# Patient Record
Sex: Female | Born: 2000 | Race: Black or African American | Hispanic: No | Marital: Single
Health system: Southern US, Community
[De-identification: ages and names within clinical notes are randomized; demographics above are authoritative.]

---

## 2020-12-05 ENCOUNTER — Emergency Department (HOSPITAL_COMMUNITY)
Admission: EM | Admit: 2020-12-05 | Discharge: 2020-12-05 | Disposition: A | Discharge status: Home / Self Care | Payer: BC Managed Care – PPO | Attending: Emergency Medicine | Admitting: Emergency Medicine

## 2020-12-05 ENCOUNTER — Other Ambulatory Visit: Payer: Self-pay

## 2020-12-05 ENCOUNTER — Emergency Department (HOSPITAL_COMMUNITY): Payer: BC Managed Care – PPO

## 2020-12-05 ENCOUNTER — Encounter (HOSPITAL_COMMUNITY): Payer: Self-pay

## 2020-12-05 DIAGNOSIS — R059 Cough, unspecified: Secondary | ICD-10-CM | POA: Diagnosis present

## 2020-12-05 DIAGNOSIS — J069 Acute upper respiratory infection, unspecified: Secondary | ICD-10-CM

## 2020-12-05 DIAGNOSIS — J988 Other specified respiratory disorders: Secondary | ICD-10-CM | POA: Diagnosis not present

## 2020-12-05 MED ORDER — AZITHROMYCIN 250 MG PO TABS: 250.0000 mg | ORAL_TABLET | Freq: Every day | ORAL | 0 refills | Status: AC

## 2020-12-05 NOTE — Discharge Instructions (Signed)
Recommend follow-up with your primary doctor.  Take antibiotic as prescribed.  If you develop blood in sputum, difficulty breathing, chest pain or other new concerning symptom, return to ER for reassessment.

## 2020-12-05 NOTE — ED Notes (Signed)
An After Visit Summary was printed and given to the patient. Discharge instructions given and no further questions at this time.  

## 2020-12-05 NOTE — ED Provider Notes (Signed)
[facility]-EMERGENCY DEPT Provider Note   CSN: [id number] Arrival date & time: 12/05/20  1812     History Chief Complaint  Patient presents with  . Cough    Erica Villan[facility]va is a 20 y.o. female.  Patient reports for the past 2 or 3 weeks she has been having intermittent cough.  Productive, mostly green sputum.  Also has had some nasal congestion.  No difficulty in breathing, no chest pain, no fevers.  States that there was a COVID outbreak at her school however she states that she was recently tested and was negative for COVID-19.  She denies any medical problems.  Had a sore throat last week but this has since resolved.  HPI     History reviewed. No pertinent past medical history.  There are no problems to display for this patient.   History reviewed. No pertinent surgical history.   OB History   No obstetric history on file.     History reviewed. No pertinent family history.     Home Medications Prior to Admission medications   Medication Sig Start Date End Date Taking? Authorizing Provider  azithromycin (ZITHROMAX) 250 MG tablet Take 1 tablet (250 mg total) by mouth daily. Take first 2 tablets together, then 1 every day until finished. 12/05/20  Yes Milagros Loll, MD    Allergies    Patient has no known allergies.  Review of Systems   Review of Systems  Constitutional: Negative for chills, fatigue and fever.  HENT: Positive for sore throat. Negative for ear pain.   Eyes: Negative for pain and visual disturbance.  Respiratory: Positive for cough. Negative for shortness of breath.   Cardiovascular: Negative for chest pain and palpitations.  Gastrointestinal: Negative for abdominal pain and vomiting.  Genitourinary: Negative for dysuria and hematuria.  Musculoskeletal: Negative for arthralgias and back pain.  Skin: Negative for color change and rash.  Neurological: Negative for seizures and syncope.  All other systems reviewed and are  negative.   Physical Exam Updated Vital Signs BP 120/73 (BP Location: Left Arm)   Pulse 60   Temp 98.6 F (37 C) (Oral)   Resp 16   Ht 5\' 4"  (1.626 m)   Wt 86.3 kg   LMP 12/05/2020   SpO2 98%   BMI 32.66 kg/m   Physical Exam Vitals and nursing note reviewed.  Constitutional:      General: She is not in acute distress.    Appearance: She is well-developed.  HENT:     Head: Normocephalic and atraumatic.  Eyes:     Conjunctiva/sclera: Conjunctivae normal.  Cardiovascular:     Rate and Rhythm: Normal rate and regular rhythm.     Heart sounds: No murmur heard.   Pulmonary:     Effort: Pulmonary effort is normal. No respiratory distress.     Breath sounds: Normal breath sounds.  Abdominal:     Palpations: Abdomen is soft.     Tenderness: There is no abdominal tenderness.  Musculoskeletal:     Cervical back: Neck supple.  Skin:    General: Skin is warm and dry.  Neurological:     General: No focal deficit present.     Mental Status: She is alert.  Psychiatric:        Mood and Affect: Mood normal.     ED Results / Procedures / Treatments   Labs (all labs ordered are listed, but only abnormal results are displayed) Labs Reviewed - No data to display  EKG  None  Radiology DG Chest 2 View  Result Date: 12/05/2020 CLINICAL DATA:  20 year old female with cough. EXAM: CHEST - 2 VIEW COMPARISON:  None. FINDINGS: The heart size and mediastinal contours are within normal limits. Both lungs are clear. The visualized skeletal structures are unremarkable. IMPRESSION: No active cardiopulmonary disease. Electronically Signed   By: Elgie Collard M.D.   On: 12/05/2020 19:08    Procedures Procedures   Medications Ordered in ED Medications - No data to display  ED Course  I have reviewed the triage vital signs and the nursing notes.  Pertinent labs & imaging results that were available during my care of the patient were reviewed by me and considered in my medical  decision making (see chart for details).    MDM Rules/Calculators/A&P                         20 year old otherwise healthy lady presents to ER with concern for cough.  On exam she is well-appearing with stable vital signs.  CXR negative for pneumonia.  Suspect likely viral upper respiratory illness.  Given duration and productive cough, raise concern for possible bronchitis.  Will give course of azithromycin to cover possible bacterial cause.  At present believe patient is appropriate for outpatient management.  Recommended follow-up with her primary doctor.  After the discussed management above, the patient was determined to be safe for discharge.  The patient was in agreement with this plan and all questions regarding their care were answered.  ED return precautions were discussed and the patient will return to the ED with any significant worsening of condition.    Final Clinical Impression(s) / ED Diagnoses Final diagnoses:  Viral upper respiratory illness    Rx / DC Orders ED Discharge Orders         Ordered    azithromycin (ZITHROMAX) 250 MG tablet  Daily        12/05/20 1944           Milagros Loll, MD 12/05/20 1947

## 2020-12-05 NOTE — ED Triage Notes (Signed)
Pt c/o cough x3 weeks. Pt has no other complaints at this time. Pt states she tested COVID- yesterday at her school.

## 2022-03-27 IMAGING — CR DG CHEST 2V
2 series · 2 of 2 positions shown · non-contrast
Comparison: None.

CLINICAL DATA: 19-year-old female with cough.

EXAM:
CHEST - 2 VIEW

[w chest pa]
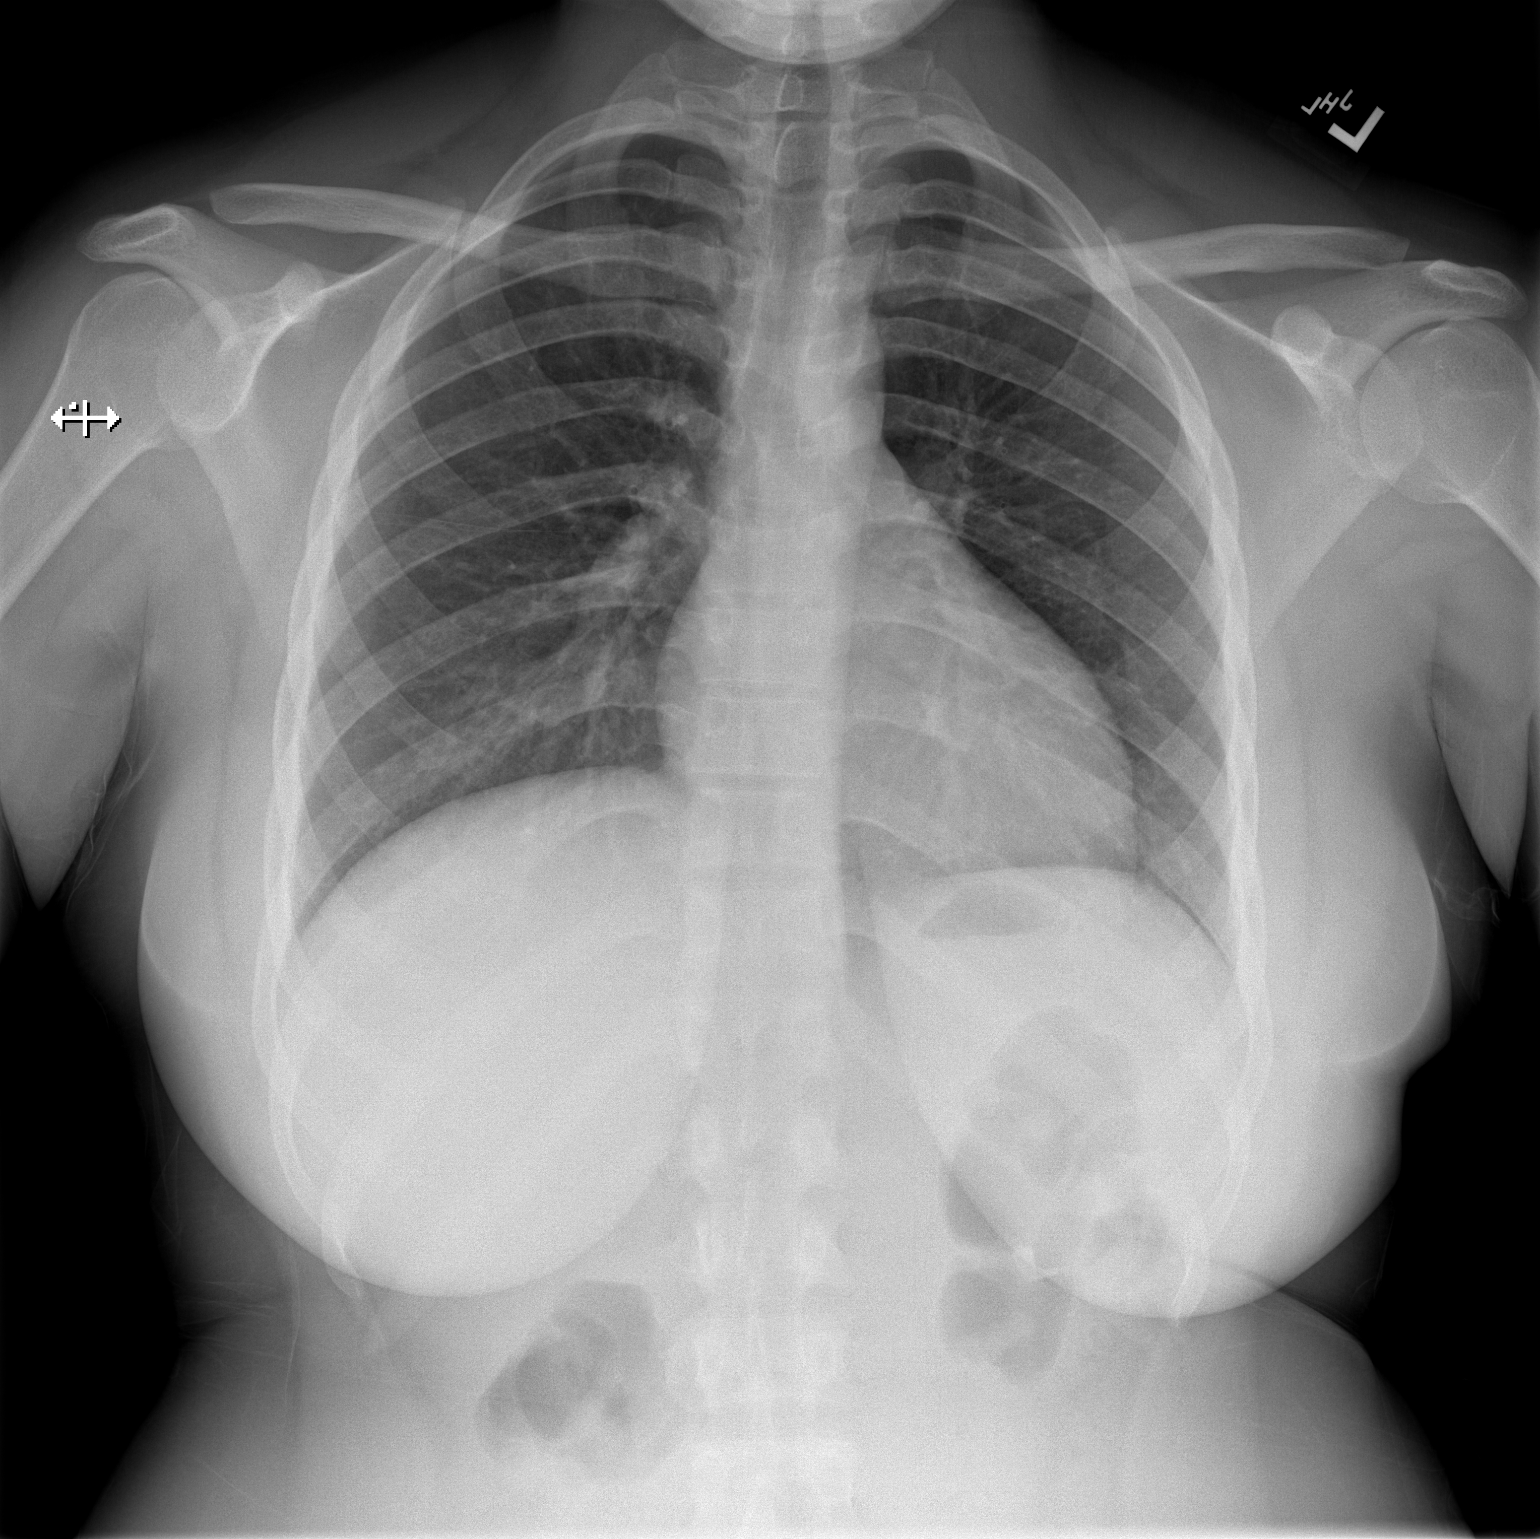

[w chest lat]
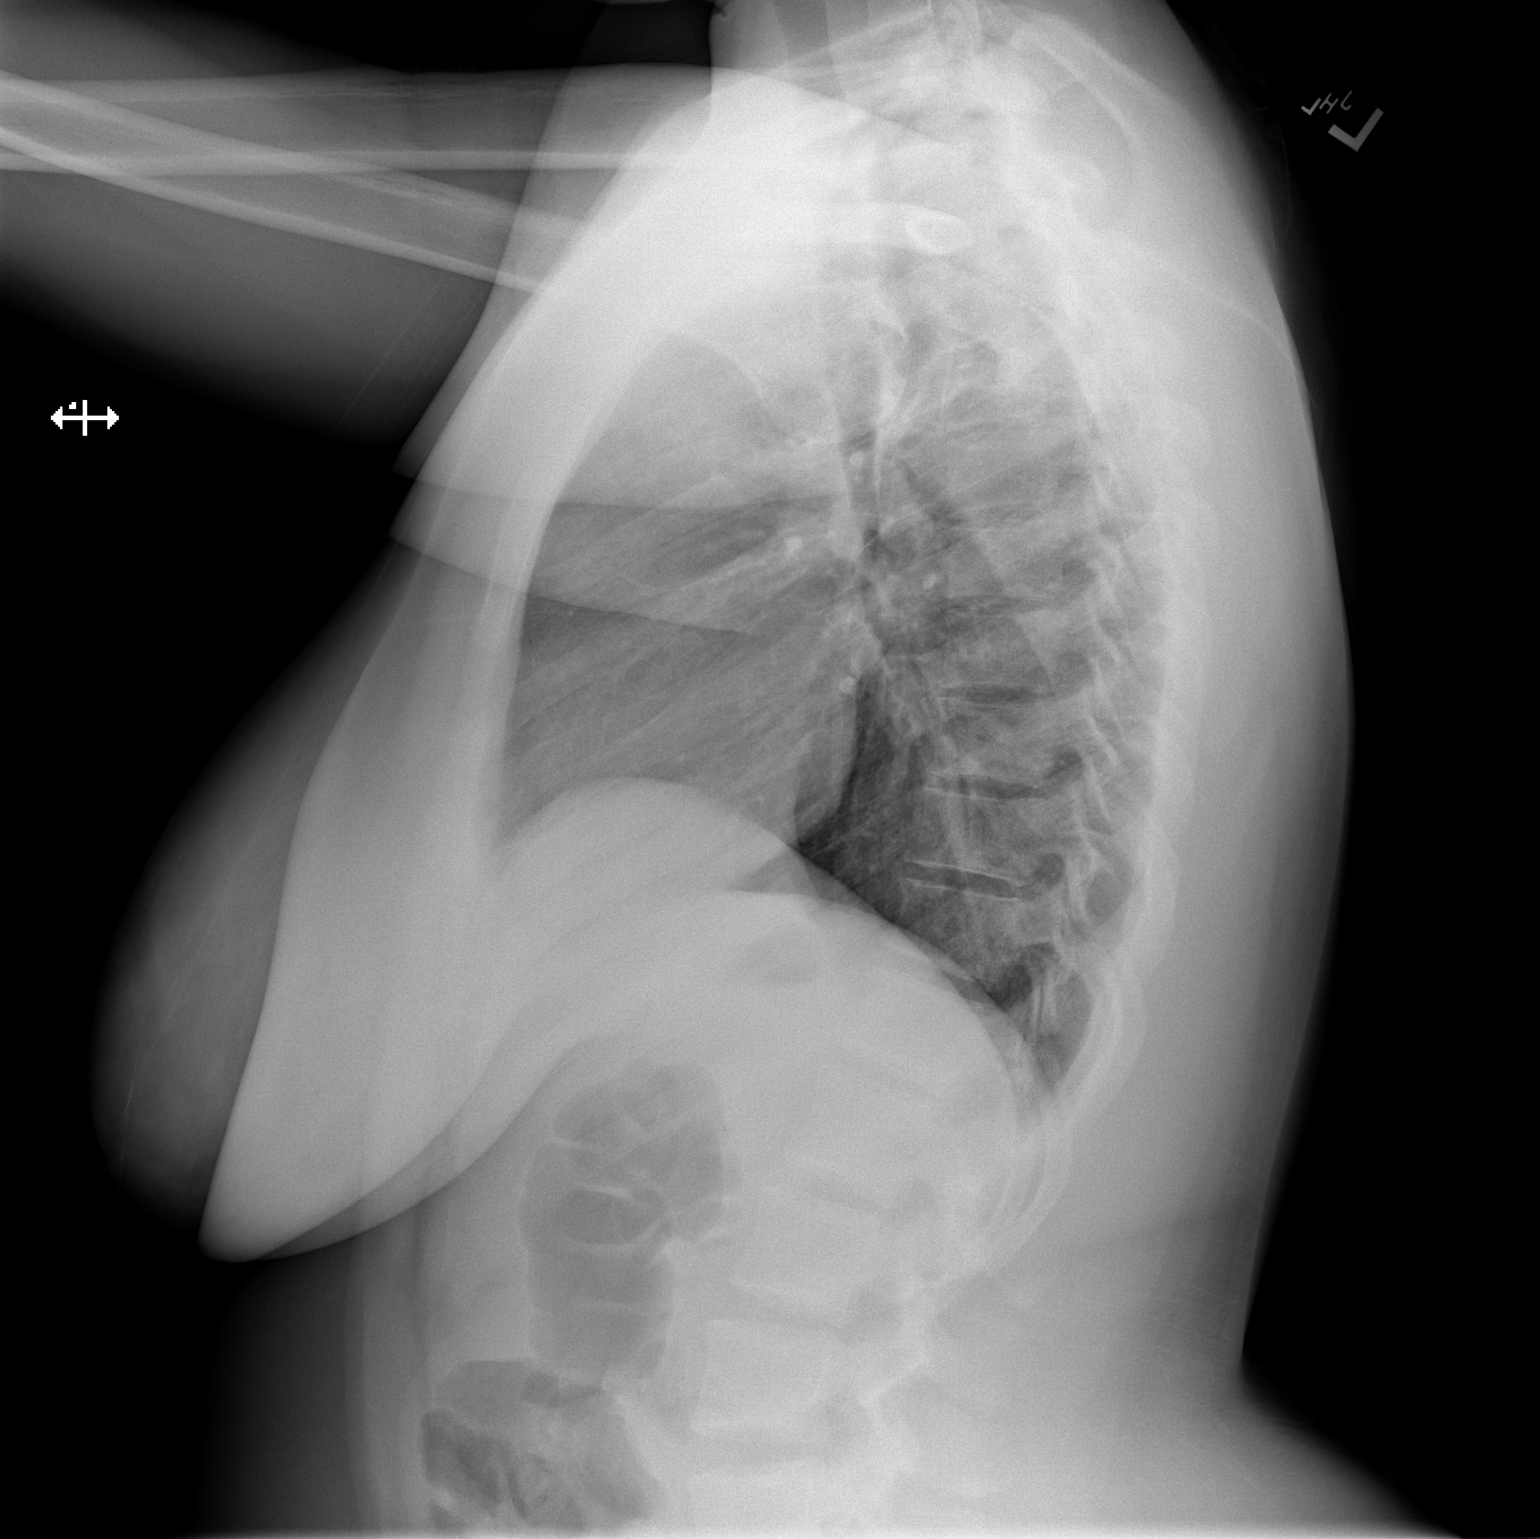

[2 of 2 positions shown; findings below may reference images not displayed]

FINDINGS: The heart size and mediastinal contours are within normal limits.
Both lungs are clear. The visualized skeletal structures are
unremarkable.
IMPRESSION: No active cardiopulmonary disease.
